# Patient Record
Sex: Male | Born: 1940 | Race: White | Hispanic: No | Marital: Married | Smoking: Never smoker
Health system: Southern US, Community
[De-identification: ages and names within clinical notes are randomized; demographics above are authoritative.]

## PROBLEM LIST (undated history)

## (undated) DIAGNOSIS — I2510 Atherosclerotic heart disease of native coronary artery without angina pectoris: Secondary | ICD-10-CM

## (undated) DIAGNOSIS — E119 Type 2 diabetes mellitus without complications: Secondary | ICD-10-CM

## (undated) DIAGNOSIS — I4891 Unspecified atrial fibrillation: Secondary | ICD-10-CM

## (undated) DIAGNOSIS — I10 Essential (primary) hypertension: Secondary | ICD-10-CM

---

## 2016-07-03 ENCOUNTER — Emergency Department (HOSPITAL_COMMUNITY): Payer: Medicare Other

## 2016-07-03 ENCOUNTER — Encounter (HOSPITAL_COMMUNITY): Payer: Self-pay | Admitting: Emergency Medicine

## 2016-07-03 ENCOUNTER — Emergency Department (HOSPITAL_COMMUNITY)
Admission: EM | Admit: 2016-07-03 | Discharge: 2016-07-03 | Disposition: A | Discharge status: Home / Self Care | Payer: Medicare Other | Attending: Emergency Medicine | Admitting: Emergency Medicine

## 2016-07-03 DIAGNOSIS — I2510 Atherosclerotic heart disease of native coronary artery without angina pectoris: Secondary | ICD-10-CM | POA: Insufficient documentation

## 2016-07-03 DIAGNOSIS — M791 Myalgia: Secondary | ICD-10-CM | POA: Diagnosis not present

## 2016-07-03 DIAGNOSIS — R0602 Shortness of breath: Secondary | ICD-10-CM | POA: Diagnosis present

## 2016-07-03 DIAGNOSIS — Z7982 Long term (current) use of aspirin: Secondary | ICD-10-CM | POA: Diagnosis not present

## 2016-07-03 DIAGNOSIS — I10 Essential (primary) hypertension: Secondary | ICD-10-CM | POA: Insufficient documentation

## 2016-07-03 DIAGNOSIS — Z79899 Other long term (current) drug therapy: Secondary | ICD-10-CM | POA: Diagnosis not present

## 2016-07-03 DIAGNOSIS — J181 Lobar pneumonia, unspecified organism: Secondary | ICD-10-CM | POA: Diagnosis not present

## 2016-07-03 DIAGNOSIS — J189 Pneumonia, unspecified organism: Secondary | ICD-10-CM

## 2016-07-03 DIAGNOSIS — Z794 Long term (current) use of insulin: Secondary | ICD-10-CM | POA: Insufficient documentation

## 2016-07-03 HISTORY — DX: Type 2 diabetes mellitus without complications: E11.9

## 2016-07-03 HISTORY — DX: Unspecified atrial fibrillation: I48.91

## 2016-07-03 HISTORY — DX: Atherosclerotic heart disease of native coronary artery without angina pectoris: I25.10

## 2016-07-03 HISTORY — DX: Essential (primary) hypertension: I10

## 2016-07-03 LAB — BASIC METABOLIC PANEL
Anion gap: 9 (ref 5–15)
BUN: 43 mg/dL — AB (ref 6–20)
CO2: 21 mmol/L — ABNORMAL LOW (ref 22–32)
CREATININE: 2.28 mg/dL — AB (ref 0.61–1.24)
Calcium: 9 mg/dL (ref 8.9–10.3)
Chloride: 105 mmol/L (ref 101–111)
GFR calc Af Amer: 31 mL/min — ABNORMAL LOW (ref >60)
GFR, EST NON AFRICAN AMERICAN: 26 mL/min — AB (ref >60)
GLUCOSE: 224 mg/dL — AB (ref 65–99)
POTASSIUM: 3.9 mmol/L (ref 3.5–5.1)
SODIUM: 135 mmol/L (ref 135–145)

## 2016-07-03 LAB — CBC
HEMATOCRIT: 32.1 % — AB (ref 39.0–52.0)
Hemoglobin: 10.8 g/dL — ABNORMAL LOW (ref 13.0–17.0)
MCH: 29 pg (ref 26.0–34.0)
MCHC: 33.6 g/dL (ref 30.0–36.0)
MCV: 86.3 fL (ref 78.0–100.0)
PLATELETS: 205 10*3/uL (ref 150–400)
RBC: 3.72 MIL/uL — ABNORMAL LOW (ref 4.22–5.81)
RDW: 12.5 % (ref 11.5–15.5)
WBC: 11.6 10*3/uL — AB (ref 4.0–10.5)

## 2016-07-03 LAB — I-STAT TROPONIN, ED: Troponin i, poc: 0.01 ng/mL (ref 0.00–0.08)

## 2016-07-03 MED ORDER — ALBUTEROL SULFATE HFA 108 (90 BASE) MCG/ACT IN AERS: 1.0000 | INHALATION_SPRAY | Freq: Four times a day (QID) | RESPIRATORY_TRACT | 0 refills | Status: AC | PRN

## 2016-07-03 MED ORDER — LEVOFLOXACIN IN D5W 750 MG/150ML IV SOLN
750.0000 mg | Freq: Once | INTRAVENOUS | Status: AC
  Administered 2016-07-03: 750 mg via INTRAVENOUS
  Filled 2016-07-03: qty 150

## 2016-07-03 MED ORDER — IPRATROPIUM-ALBUTEROL 0.5-2.5 (3) MG/3ML IN SOLN
3.0000 mL | Freq: Once | RESPIRATORY_TRACT | Status: AC
  Administered 2016-07-03: 3 mL via RESPIRATORY_TRACT
  Filled 2016-07-03: qty 3

## 2016-07-03 MED ORDER — LEVOFLOXACIN 750 MG PO TABS: 750.0000 mg | ORAL_TABLET | Freq: Every day | ORAL | 0 refills | Status: AC

## 2016-07-03 NOTE — ED Notes (Signed)
Waiting for antibiotics to finish before we can discharge.

## 2016-07-03 NOTE — ED Triage Notes (Signed)
Pt here with increased SOB and dry cough x several days; pt appears labored at present

## 2016-07-03 NOTE — ED Notes (Signed)
Returned from radiology. 

## 2016-07-03 NOTE — ED Provider Notes (Signed)
[facility]-EMERGENCY DEPT Provider Note   CSN: [id number] Arrival date & time: 07/03/16  1215     History   Chief Complaint Chief Complaint  Patient presents with  . Shortness of Breath    HPI Ryan Mccoy is a 76 y.o. male.  Patient with a complaint of shortness of breath dry cough for 3 days. Patient's wife with an upper respiratory infection recently but recovered. Patient without significant congestion. Patient just feels short of breath which is unusual for him he does not have a history of bronchitis or history of emphysema or asthma. Patient states he chills and bodyaches but no distinct fever. No nausea vomiting or diarrhea. No rash.      Past Medical History:  Diagnosis Date  . A-fib (HCC)   . Coronary artery disease   . Diabetes mellitus without complication (HCC)   . Hypertension     There are no active problems to display for this patient.   History reviewed. No pertinent surgical history.     Home Medications    Prior to Admission medications   Medication Sig Start Date End Date Taking? Authorizing Provider  albuterol (PROVENTIL HFA;VENTOLIN HFA) 108 (90 Base) MCG/ACT inhaler Inhale 2 puffs into the lungs every 6 (six) hours as needed for wheezing or shortness of breath.   Yes [provider]  aspirin EC 81 MG tablet Take 81 mg by mouth daily.   Yes [provider]  calcium carbonate (TUMS - DOSED IN MG ELEMENTAL CALCIUM) 500 MG chewable tablet Chew 2 tablets by mouth daily as needed for indigestion or heartburn.   Yes [provider]  carvedilol (COREG) 12.5 MG tablet Take 12.5 mg by mouth 2 (two) times daily. 06/16/16  Yes [provider]  HUMALOG KWIKPEN 100 UNIT/ML KiwkPen Inject 0-14 Units into the skin as directed. Based on blood sugar and carbs 06/16/16  Yes [provider]  hydrochlorothiazide (HYDRODIURIL) 25 MG tablet Take 25 mg by mouth daily. 06/30/16  Yes [provider]  LANTUS SOLOSTAR 100 UNIT/ML  Solostar Pen Inject 44 Units into the skin at bedtime. 06/17/16  Yes [provider]  losartan (COZAAR) 100 MG tablet Take 100 mg by mouth every evening. 04/19/16  Yes [provider]  magnesium oxide (MAG-OX) 400 MG tablet Take 400 mg by mouth daily as needed (muscle aches/cramps).   Yes [provider]  nitroGLYCERIN (NITROSTAT) 0.4 MG SL tablet Place 0.4 mg under the tongue every 5 (five) minutes x 3 doses as needed for chest pain. 06/23/16  Yes [provider]  pantoprazole (PROTONIX) 40 MG tablet Take 40 mg by mouth daily before breakfast. 06/25/16  Yes [provider]  Potassium 99 MG TABS Take 99 mg by mouth daily as needed (muscle aches/cramping).   Yes [provider]  rosuvastatin (CRESTOR) 20 MG tablet Take 20 mg by mouth every evening. 04/18/16  Yes [provider]  XARELTO 15 MG TABS tablet Take 15 mg by mouth every evening. 05/28/16  Yes [provider]    Family History History reviewed. No pertinent family history.  Social History Social History  Substance Use Topics  . Smoking status: Never Smoker  . Smokeless tobacco: Never Used  . Alcohol use Yes     Allergies   Plavix [clopidogrel bisulfate] and Sulfa antibiotics   Review of Systems Review of Systems  Constitutional: Positive for chills and fatigue. Negative for fever.  HENT: Negative for congestion and sore throat.   Eyes: Negative for visual  disturbance.  Respiratory: Positive for shortness of breath. Negative for wheezing.   Cardiovascular: Negative for chest pain.  Gastrointestinal: Negative for abdominal pain, diarrhea, nausea and vomiting.  Genitourinary: Negative for dysuria.  Musculoskeletal: Positive for myalgias.  Skin: Negative for rash.  Neurological: Negative for headaches.  Hematological: Does not bruise/bleed easily.  Psychiatric/Behavioral: Negative for confusion.     Physical Exam Updated Vital Signs BP 122/81 (BP  Location: Right Arm)   Pulse (!) 52   Temp 98.5 F (36.9 C) (Oral)   Resp 20   SpO2 100%   Physical Exam  Constitutional: He is oriented to person, place, and time. He appears well-developed and well-nourished. No distress.  HENT:  Head: Normocephalic and atraumatic.  Right Ear: External ear normal.  Eyes: EOM are normal. Pupils are equal, round, and reactive to light.  Neck: Normal range of motion. Neck supple.  Cardiovascular: Normal rate and regular rhythm.   Pulmonary/Chest: Effort normal and breath sounds normal.  Abdominal: Soft. Bowel sounds are normal. There is no tenderness.  Musculoskeletal: Normal range of motion.  Neurological: He is alert and oriented to person, place, and time. No cranial nerve deficit or sensory deficit. He exhibits normal muscle tone. Coordination normal.  Skin: Skin is warm.  Nursing note and vitals reviewed.    ED Treatments / Results  Labs (all labs ordered are listed, but only abnormal results are displayed) Labs Reviewed  BASIC METABOLIC PANEL - Abnormal; Notable for the following:       Result Value   CO2 21 (*)    Glucose, Bld 224 (*)    BUN 43 (*)    Creatinine, Ser 2.28 (*)    GFR calc non Af Amer 26 (*)    GFR calc Af Amer 31 (*)    All other components within normal limits  CBC - Abnormal; Notable for the following:    WBC 11.6 (*)    RBC 3.72 (*)    Hemoglobin 10.8 (*)    HCT 32.1 (*)    All other components within normal limits  I-STAT TROPOININ, ED    EKG  EKG Interpretation  Date/Time:  Sunday July 03 2016 12:23:52 EDT Ventricular Rate:  58 PR Interval:  204 QRS Duration: 86 QT Interval:  414 QTC Calculation: 406 R Axis:   -40 Text Interpretation:  Sinus bradycardia with occasional Premature ventricular complexes and Fusion complexes Left axis deviation Inferior infarct , age undetermined Cannot rule out Anterior infarct , age undetermined Abnormal ECG No previous ECGs available Confirmed by Vanetta Mulders  [phone]) on 07/03/2016 1:58:10 PM       Radiology Dg Chest 2 View  Result Date: 07/03/2016 CLINICAL DATA:  Shortness of breath, dry cough x2 days EXAM: CHEST  2 VIEW COMPARISON:  None. FINDINGS: Lungs are essentially clear. Mild right basilar opacity, likely atelectasis. No pleural effusion or pneumothorax. The heart is normal in size. Degenerative changes of the visualized thoracolumbar spine. IMPRESSION: Mild right basilar opacity, likely atelectasis. No evidence of acute cardiopulmonary disease. Electronically Signed   By: Charline Bills M.D.   On: 07/03/2016 13:21   Ct Chest Wo Contrast  Result Date: 07/03/2016 CLINICAL DATA:  Shortness of breath and dry cough for several days. Elevated white blood cell count. EXAM: CT CHEST WITHOUT CONTRAST TECHNIQUE: Multidetector CT imaging of the chest was performed following the standard protocol without IV contrast. COMPARISON:  PA and lateral chest earlier today. FINDINGS: Cardiovascular: Calcific aortic and coronary atherosclerosis is identified. Heart size is  normal. No pericardial effusion. Mediastinum/Nodes: No pathologically enlarged lymph nodes are seen. A few calcified mediastinal nodes are noted. No hiatal hernia. Thyroid gland appears normal. Lungs/Pleura: No pleural effusion. Dense airspace opacity is seen in the medial aspect of the right lower lobe. The left lung is clear. Upper Abdomen: Calcifications in the spleen consistent with old granulomatous disease are noted. Musculoskeletal: No acute abnormality. IMPRESSION: Airspace disease in the posteromedial right lower lobe is likely due to pneumonia in this patient with cough and elevated white blood cell count. Repeat chest CT in 6-8 weeks after appropriate therapy to ensure clearing is recommended. Calcific aortic and coronary atherosclerosis. Old granulomatous disease. Electronically Signed   By: Drusilla Kannerhomas  Dalessio M.D.   On: 07/03/2016 16:21    Procedures Procedures (including critical care  time)  Medications Ordered in ED Medications  levofloxacin (LEVAQUIN) IVPB 750 mg (750 mg Intravenous New Bag/Given 07/03/16 1708)  ipratropium-albuterol (DUONEB) 0.5-2.5 (3) MG/3ML nebulizer solution 3 mL (3 mLs Nebulization Given 07/03/16 1546)     Initial Impression / Assessment and Plan / ED Course  I have reviewed the triage vital signs and the nursing notes.  Pertinent labs & imaging results that were available during my care of the patient were reviewed by me and considered in my medical decision making (see chart for details).     Chest x-ray negative for pneumonia but patient's symptoms were very consistent with a. CT scan shows right lower lobe pneumonia. Patient received IV Levaquin here. Patient will be discharged home on oral Levaquin close follow-up. Patient not hypoxic here no leukocytosis. Patient also receive albuterol inhaler.   Final Clinical Impressions(s) / ED Diagnoses   Final diagnoses:  Community acquired pneumonia of right lower lobe of lung (HCC)    New Prescriptions New Prescriptions   No medications on file     Vanetta MuldersZackowski, Aliani Caccavale, MD 07/03/16 [phone]

## 2016-07-03 NOTE — Discharge Instructions (Signed)
Follow-up with your doctor. Make an appointment. Take antibiotic as directed for the next 7 days. Use albuterol inhaler as directed for the next 7 days. Return for any new or worse symptoms. Would expect some improvement over the next 2 days. Would not expect she do get worse.

## 2016-07-03 NOTE — ED Notes (Signed)
Patient transported to CT 

## 2018-08-30 IMAGING — CT CT CHEST W/O CM
2 of 4 series · 15 of 36 positions shown, 18 images · non-contrast
Comparison: PA and lateral chest earlier today.

CLINICAL DATA: Shortness of breath and dry cough for several days.
Elevated white blood cell count.

EXAM:
CT CHEST WITHOUT CONTRAST
TECHNIQUE: Multidetector CT imaging of the chest was performed following the
standard protocol without IV contrast.

[Series 3: chest w/o 2mm st · axial · non-contrast · 0.86mm/px · z∈[+1136,+1420]mm · 12 of 166 slices shown, 15 images]
[im 12/166  mediastinal]
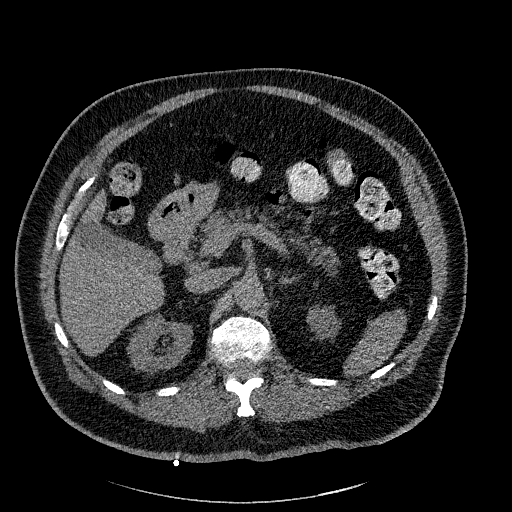
[im 12/166  lung]
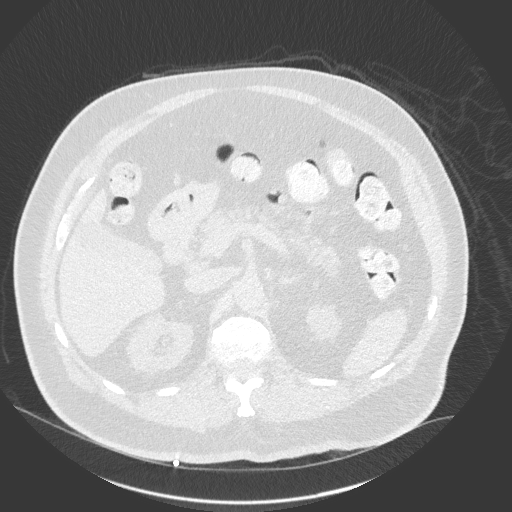
[im 24/166  lung]
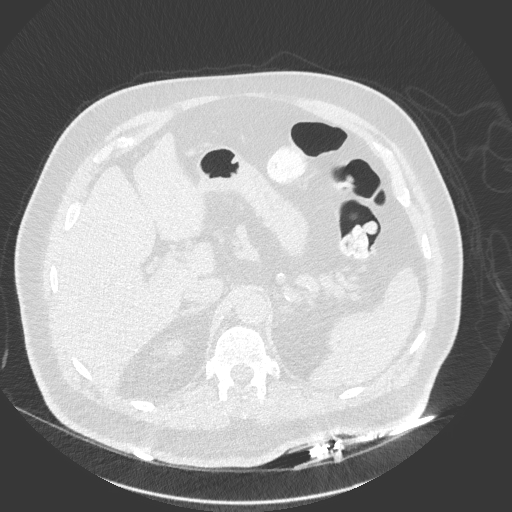
[im 36/166  lung]
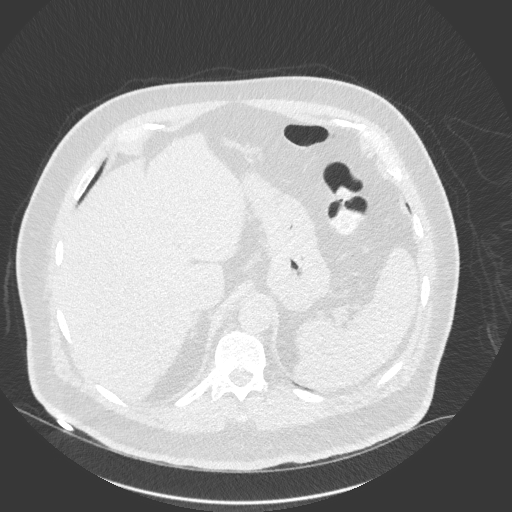
[im 48/166  lung]
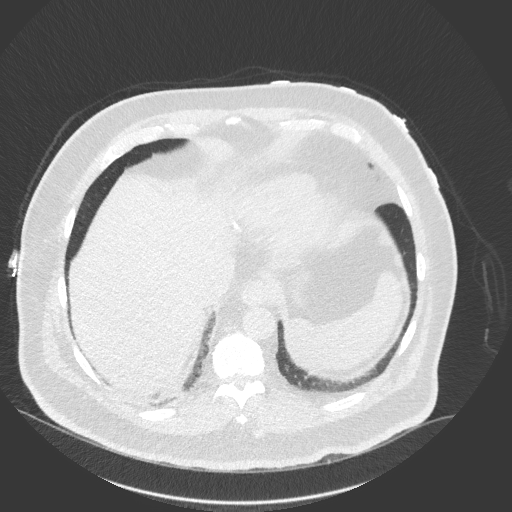
[im 59/166  mediastinal]
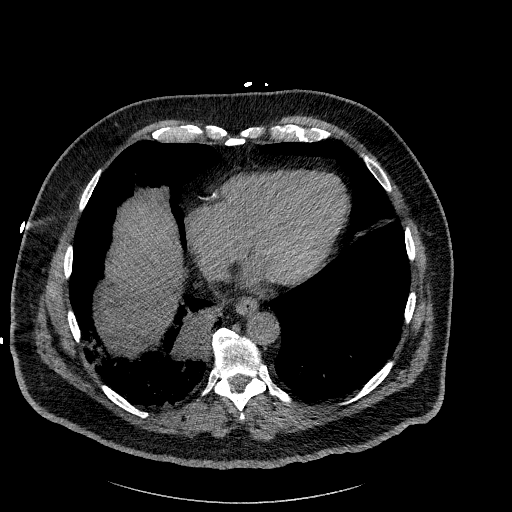
[im 59/166  lung]
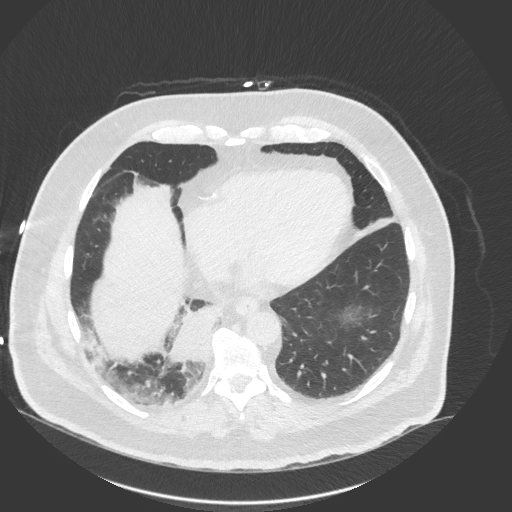
[im 71/166  lung]
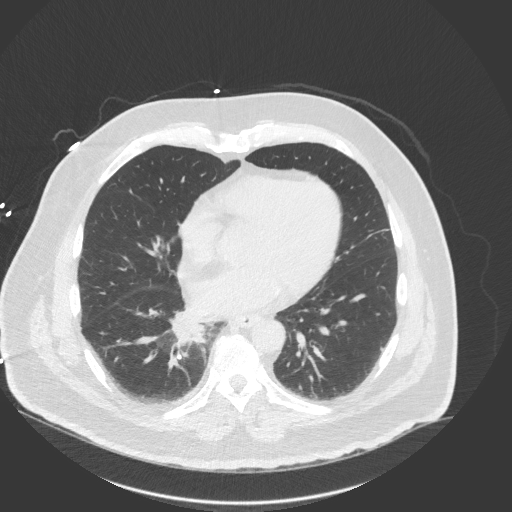
[im 95/166  lung]
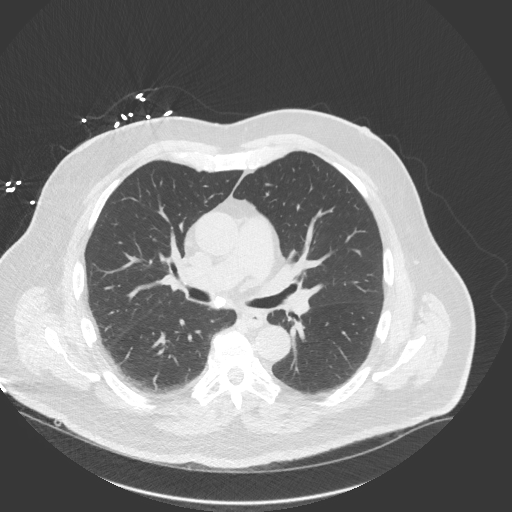
[im 107/166  lung]
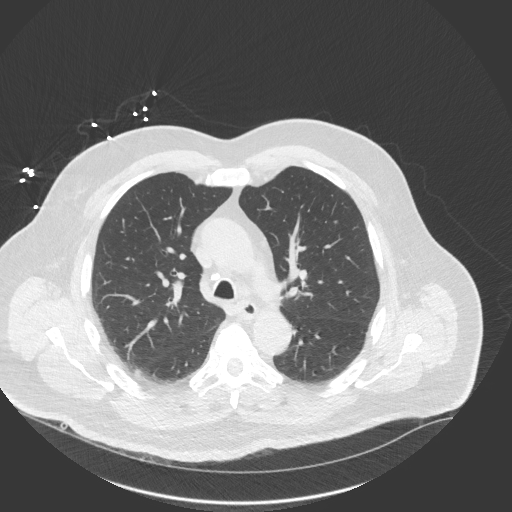
[im 118/166  mediastinal]
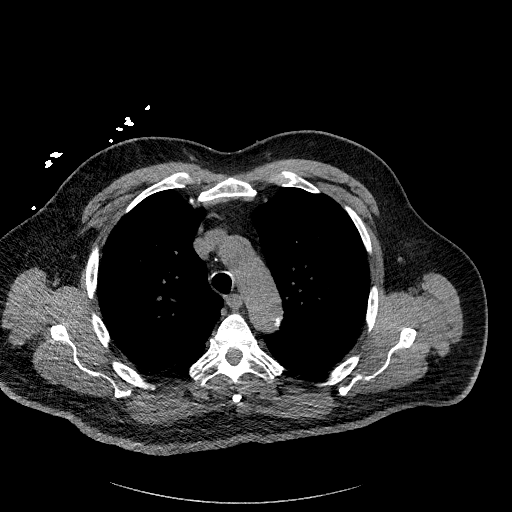
[im 118/166  lung]
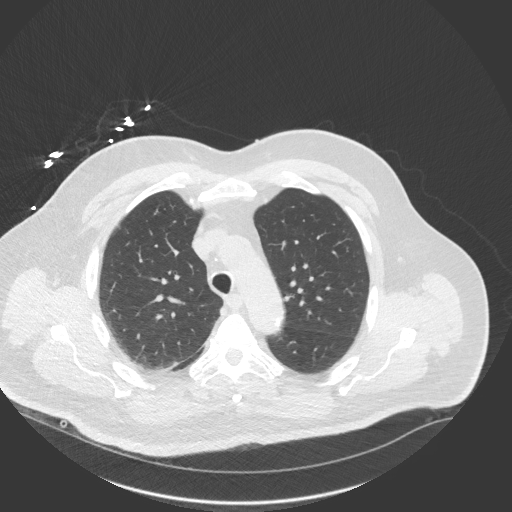
[im 130/166  lung]
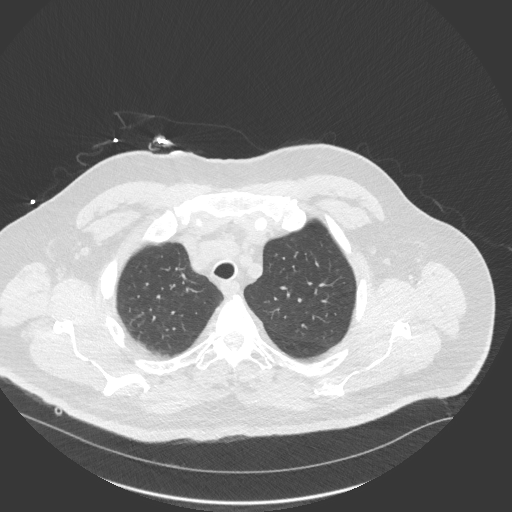
[im 142/166  lung]
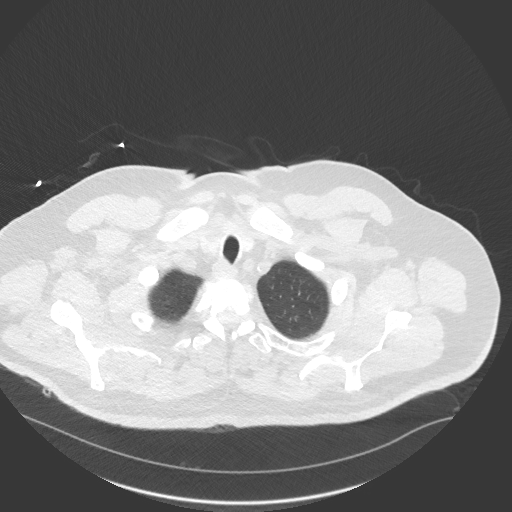
[im 154/166  lung]
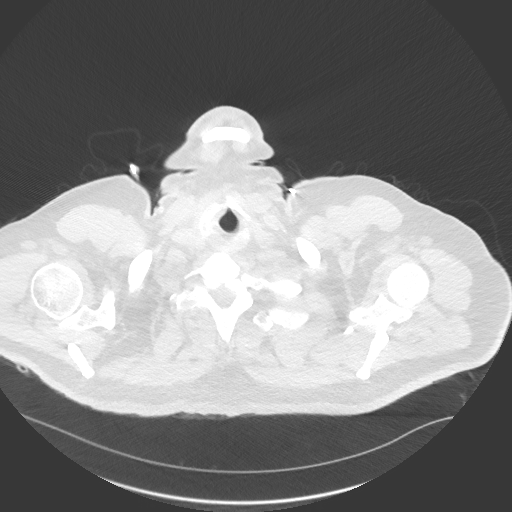

[Series 5: chest w/o 3mm st cor · coronal · non-contrast · 0.65mm/px · 3 of 109 slices shown]
[im 22/109  lung]
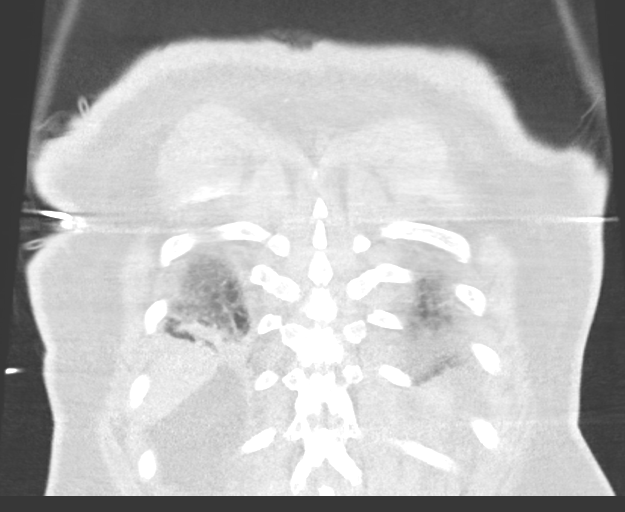
[im 44/109  lung]
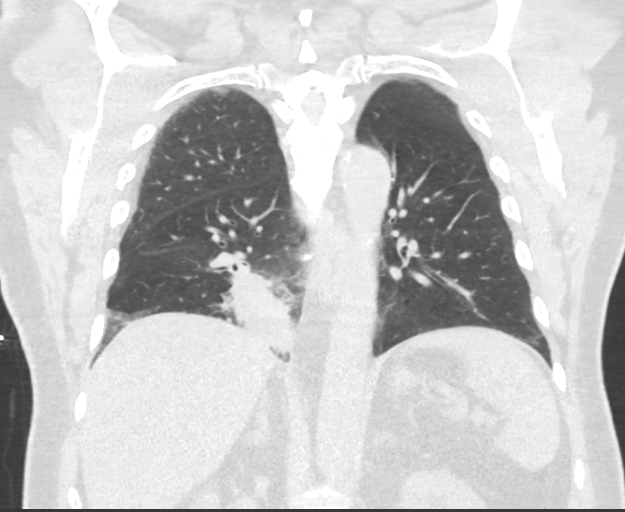
[im 65/109  lung]
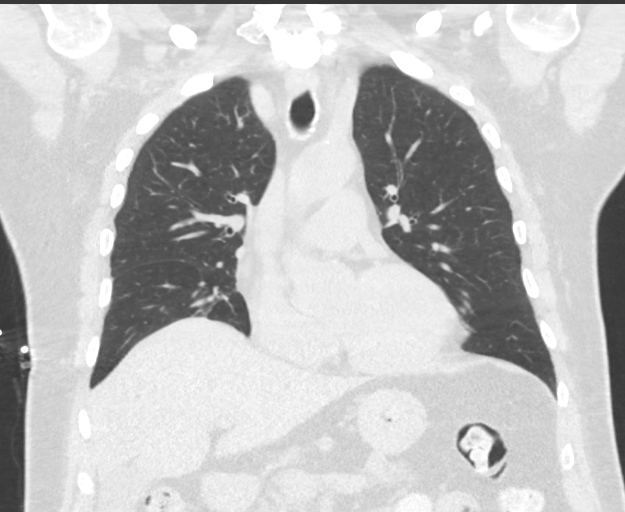

[15 of 36 positions shown; findings below may reference images not displayed]

FINDINGS: Cardiovascular: Calcific aortic and coronary atherosclerosis is
identified. Heart size is normal. No pericardial effusion.

Mediastinum/Nodes: No pathologically enlarged lymph nodes are seen.
A few calcified mediastinal nodes are noted. No hiatal hernia.
Thyroid gland appears normal.

Lungs/Pleura: No pleural effusion. Dense airspace opacity is seen in
the medial aspect of the right lower lobe. The left lung is clear.

Upper Abdomen: Calcifications in the spleen consistent with old
granulomatous disease are noted.

Musculoskeletal: No acute abnormality.
IMPRESSION: Airspace disease in the posteromedial right lower lobe is likely due
to pneumonia in this patient with cough and elevated white blood
cell count. Repeat chest CT in 6-8 weeks after appropriate therapy
to ensure clearing is recommended.

Calcific aortic and coronary atherosclerosis.

Old granulomatous disease.
